# Patient Record
Sex: Male | Born: 1995 | Hispanic: No | Marital: Single | Smoking: Never smoker
Health system: Southern US, Community
[De-identification: ages and names within clinical notes are randomized; demographics above are authoritative.]

---
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
## 2015-01-18 ENCOUNTER — Emergency Department (HOSPITAL_COMMUNITY)
Admission: EM | Admit: 2015-01-18 | Discharge: 2015-01-18 | Disposition: A | Discharge status: Home / Self Care | Payer: Self-pay | Source: Home / Self Care | Attending: Emergency Medicine | Admitting: Emergency Medicine

## 2015-01-18 ENCOUNTER — Encounter (HOSPITAL_COMMUNITY): Payer: Self-pay | Admitting: Emergency Medicine

## 2015-01-18 ENCOUNTER — Emergency Department (INDEPENDENT_AMBULATORY_CARE_PROVIDER_SITE_OTHER): Payer: Self-pay

## 2015-01-18 DIAGNOSIS — S2232XA Fracture of one rib, left side, initial encounter for closed fracture: Secondary | ICD-10-CM

## 2015-01-18 MED ORDER — IBUPROFEN 800 MG PO TABS
800.0000 mg | ORAL_TABLET | Freq: Three times a day (TID) | ORAL | Status: AC | PRN
Start: 2015-01-18 — End: ?

## 2015-01-18 MED ORDER — TRAMADOL HCL 50 MG PO TABS: 50.0000 mg | ORAL_TABLET | Freq: Four times a day (QID) | ORAL | Status: AC | PRN

## 2015-01-18 NOTE — Discharge Instructions (Signed)
You may have a fractured rib. It is not displaced. This will heal, but it will take 4-6 weeks. Take ibuprofen 800 mg 3 times a day as needed for pain. Use tramadol twice a day as needed for severe pain. Do not drive while taking this medicine. No mountain biking until the pain has resolved. Follow-up as needed.
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
## 2015-02-09 ENCOUNTER — Encounter (HOSPITAL_COMMUNITY): Payer: Self-pay | Admitting: *Deleted

## 2015-02-09 ENCOUNTER — Emergency Department (INDEPENDENT_AMBULATORY_CARE_PROVIDER_SITE_OTHER)
Admission: EM | Admit: 2015-02-09 | Discharge: 2015-02-09 | Disposition: A | Discharge status: Home / Self Care | Payer: Self-pay | Source: Home / Self Care | Attending: Family Medicine | Admitting: Family Medicine

## 2015-02-09 ENCOUNTER — Emergency Department (INDEPENDENT_AMBULATORY_CARE_PROVIDER_SITE_OTHER): Payer: Self-pay

## 2015-02-09 DIAGNOSIS — R0789 Other chest pain: Secondary | ICD-10-CM

## 2015-02-09 NOTE — Discharge Instructions (Signed)
Heat, advil and activity as tolerated,
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
## 2015-02-09 NOTE — ED Notes (Signed)
Pt reports   Here  For  A  followup    After  Rib  Fracture     Continues  To  Have  l  Sided  Pain  Especially  When he  Takes  A  Deep  Breath           He  Is  Sitting  Upright  On  The  Exam room   Speaking   In  Complete  sentances

## 2015-12-21 ENCOUNTER — Ambulatory Visit (INDEPENDENT_AMBULATORY_CARE_PROVIDER_SITE_OTHER): Payer: Self-pay | Admitting: Physician Assistant

## 2015-12-21 VITALS — BP 122/82 | HR 79 | Temp 97.9°F | Resp 16 | Ht 74.0 in | Wt 186.6 lb

## 2015-12-21 DIAGNOSIS — M5432 Sciatica, left side: Secondary | ICD-10-CM

## 2015-12-21 MED ORDER — PREDNISONE 20 MG PO TABS: ORAL_TABLET | ORAL | Status: AC

## 2015-12-21 MED ORDER — MELOXICAM 15 MG PO TABS: 15.0000 mg | ORAL_TABLET | Freq: Every day | ORAL | Status: AC

## 2015-12-21 MED ORDER — CYCLOBENZAPRINE HCL 10 MG PO TABS: 10.0000 mg | ORAL_TABLET | Freq: Three times a day (TID) | ORAL | Status: DC | PRN

## 2015-12-21 NOTE — Patient Instructions (Addendum)
IF you received an x-ray today, you will receive an invoice from [facility]. Please contact [facility] at [phone] with questions or concerns regarding your invoice.   IF you received labwork today, you will receive an invoice from [employer]. Please contact [facility] at [phone] with questions or concerns regarding your invoice.   Our billing staff will not be able to assist you with questions regarding bills from these companies.  You will be contacted with the lab results as soon as they are available. The fastest way to get your results is to activate your My Chart account. Instructions are located on the last page of this paperwork. If you have not heard from [place] regarding the results in 2 weeks, please contact this office.   Please ice the buttocks pain 3 times per day for 15 minutes.  Please do this directly after stretches.  Prior to stretches, use warm heat.  I would like you to do three of these pictures daily.  You will do the exercise in 5 reps.  This will progress to 8 reps after 1 week.  If it says to hold a position, you hold for 10 seconds.  Remember, directly after stretches you are icing.  Do not take the mobic with naproxen or ibuprofen.  You are able to take tylenol as needed. The flexeril is sedating (will make you sleepy).  Please do not operate heavy machinery (driving, etc.) with the use of this medicine.  Sciatica With Rehab The sciatic nerve runs from the back down the leg and is responsible for sensation and control of the muscles in the back (posterior) side of the thigh, lower leg, and foot. Sciatica is a condition that is characterized by inflammation of this nerve.  SYMPTOMS   Signs of nerve damage, including numbness and/or weakness along the posterior side of the lower extremity.  Pain in the back of the thigh that may also travel down the leg.  Pain that worsens when sitting for long periods of  time.  Occasionally, pain in the back or buttock. CAUSES  Inflammation of the sciatic nerve is the cause of sciatica. The inflammation is due to something irritating the nerve. Common sources of irritation include:  Sitting for long periods of time.  Direct trauma to the nerve.  Arthritis of the spine.  Herniated or ruptured disk.  Slipping of the vertebrae (spondylolisthesis).  Pressure from soft tissues, such as muscles or ligament-like tissue (fascia). RISK INCREASES WITH:  Sports that place pressure or stress on the spine (football or weightlifting).  Poor strength and flexibility.  Failure to warm up properly before activity.  Family history of low back pain or disk disorders.  Previous back injury or surgery.  Poor body mechanics, especially when lifting, or poor posture. PREVENTION   Warm up and stretch properly before activity.  Maintain physical fitness:  Strength, flexibility, and endurance.  Cardiovascular fitness.  Learn and use proper technique, especially with posture and lifting. When possible, have coach correct improper technique.  Avoid activities that place stress on the spine. PROGNOSIS If treated properly, then sciatica usually resolves within 6 weeks. However, occasionally surgery is necessary.  RELATED COMPLICATIONS   Permanent nerve damage, including pain, numbness, tingle, or weakness.  Chronic back pain.  Risks of surgery: infection, bleeding, nerve damage, or damage to surrounding tissues. TREATMENT Treatment initially involves resting from any activities that aggravate your symptoms. The use of ice and medication may help reduce pain and  inflammation. The use of strengthening and stretching exercises may help reduce pain with activity. These exercises may be performed at home or with referral to a therapist. A therapist may recommend further treatments, such as transcutaneous electronic nerve stimulation (TENS) or ultrasound. Your  caregiver may recommend corticosteroid injections to help reduce inflammation of the sciatic nerve. If symptoms persist despite non-surgical (conservative) treatment, then surgery may be recommended. MEDICATION  If pain medication is necessary, then nonsteroidal anti-inflammatory medications, such as aspirin and ibuprofen, or other minor pain relievers, such as acetaminophen, are often recommended.  Do not take pain medication for 7 days before surgery.  Prescription pain relievers may be given if deemed necessary by your caregiver. Use only as directed and only as much as you need.  Ointments applied to the skin may be helpful.  Corticosteroid injections may be given by your caregiver. These injections should be reserved for the most serious cases, because they may only be given a certain number of times. HEAT AND COLD  Cold treatment (icing) relieves pain and reduces inflammation. Cold treatment should be applied for 10 to 15 minutes every 2 to 3 hours for inflammation and pain and immediately after any activity that aggravates your symptoms. Use ice packs or massage the area with a piece of ice (ice massage).  Heat treatment may be used prior to performing the stretching and strengthening activities prescribed by your caregiver, physical therapist, or athletic trainer. Use a heat pack or soak the injury in warm water. SEEK MEDICAL CARE IF:  Treatment seems to offer no benefit, or the condition worsens.  Any medications produce adverse side effects. EXERCISES  RANGE OF MOTION (ROM) AND STRETCHING EXERCISES - Sciatica Most people with sciatic will find that their symptoms worsen with either excessive bending forward (flexion) or arching at the low back (extension). The exercises which will help resolve your symptoms will focus on the opposite motion. Your physician, physical therapist or athletic trainer will help you determine which exercises will be most helpful to resolve your low back  pain. Do not complete any exercises without first consulting with your clinician. Discontinue any exercises which worsen your symptoms until you speak to your clinician. If you have pain, numbness or tingling which travels down into your buttocks, leg or foot, the goal of the therapy is for these symptoms to move closer to your back and eventually resolve. Occasionally, these leg symptoms will get better, but your low back pain may worsen; this is typically an indication of progress in your rehabilitation. Be certain to be very alert to any changes in your symptoms and the activities in which you participated in the 24 hours prior to the change. Sharing this information with your clinician will allow him/her to most efficiently treat your condition. These exercises may help you when beginning to rehabilitate your injury. Your symptoms may resolve with or without further involvement from your physician, physical therapist or athletic trainer. While completing these exercises, remember:   Restoring tissue flexibility helps normal motion to return to the joints. This allows healthier, less painful movement and activity.  An effective stretch should be held for at least 30 seconds.  A stretch should never be painful. You should only feel a gentle lengthening or release in the stretched tissue. FLEXION RANGE OF MOTION AND STRETCHING EXERCISES: STRETCH - Flexion, Single Knee to Chest   Lie on a firm bed or floor with both legs extended in front of you.  Keeping one leg in contact with  the floor, bring your opposite knee to your chest. Hold your leg in place by either grabbing behind your thigh or at your knee.  Pull until you feel a gentle stretch in your low back. Hold __________ seconds.  Slowly release your grasp and repeat the exercise with the opposite side. Repeat __________ times. Complete this exercise __________ times per day.  STRETCH - Flexion, Double Knee to Chest  Lie on a firm bed or  floor with both legs extended in front of you.  Keeping one leg in contact with the floor, bring your opposite knee to your chest.  Tense your stomach muscles to support your back and then lift your other knee to your chest. Hold your legs in place by either grabbing behind your thighs or at your knees.  Pull both knees toward your chest until you feel a gentle stretch in your low back. Hold __________ seconds.  Tense your stomach muscles and slowly return one leg at a time to the floor. Repeat __________ times. Complete this exercise __________ times per day.  STRETCH - Low Trunk Rotation   Lie on a firm bed or floor. Keeping your legs in front of you, bend your knees so they are both pointed toward the ceiling and your feet are flat on the floor.  Extend your arms out to the side. This will stabilize your upper body by keeping your shoulders in contact with the floor.  Gently and slowly drop both knees together to one side until you feel a gentle stretch in your low back. Hold for __________ seconds.  Tense your stomach muscles to support your low back as you bring your knees back to the starting position. Repeat the exercise to the other side. Repeat __________ times. Complete this exercise __________ times per day  EXTENSION RANGE OF MOTION AND FLEXIBILITY EXERCISES: STRETCH - Extension, Prone on Elbows  Lie on your stomach on the floor, a bed will be too soft. Place your palms about shoulder width apart and at the height of your head.  Place your elbows under your shoulders. If this is too painful, stack pillows under your chest.  Allow your body to relax so that your hips drop lower and make contact more completely with the floor.  Hold this position for __________ seconds.  Slowly return to lying flat on the floor. Repeat __________ times. Complete this exercise __________ times per day.  RANGE OF MOTION - Extension, Prone Press Ups  Lie on your stomach on the floor, a bed  will be too soft. Place your palms about shoulder width apart and at the height of your head.  Keeping your back as relaxed as possible, slowly straighten your elbows while keeping your hips on the floor. You may adjust the placement of your hands to maximize your comfort. As you gain motion, your hands will come more underneath your shoulders.  Hold this position __________ seconds.  Slowly return to lying flat on the floor. Repeat __________ times. Complete this exercise __________ times per day.  STRENGTHENING EXERCISES - Sciatica  These exercises may help you when beginning to rehabilitate your injury. These exercises should be done near your "sweet spot." This is the neutral, low-back arch, somewhere between fully rounded and fully arched, that is your least painful position. When performed in this safe range of motion, these exercises can be used for people who have either a flexion or extension based injury. These exercises may resolve your symptoms with or without further involvement from  your physician, physical therapist or athletic trainer. While completing these exercises, remember:   Muscles can gain both the endurance and the strength needed for everyday activities through controlled exercises.  Complete these exercises as instructed by your physician, physical therapist or athletic trainer. Progress with the resistance and repetition exercises only as your caregiver advises.  You may experience muscle soreness or fatigue, but the pain or discomfort you are trying to eliminate should never worsen during these exercises. If this pain does worsen, stop and make certain you are following the directions exactly. If the pain is still present after adjustments, discontinue the exercise until you can discuss the trouble with your clinician. STRENGTHENING - Deep Abdominals, Pelvic Tilt   Lie on a firm bed or floor. Keeping your legs in front of you, bend your knees so they are both pointed  toward the ceiling and your feet are flat on the floor.  Tense your lower abdominal muscles to press your low back into the floor. This motion will rotate your pelvis so that your tail bone is scooping upwards rather than pointing at your feet or into the floor.  With a gentle tension and even breathing, hold this position for __________ seconds. Repeat __________ times. Complete this exercise __________ times per day.  STRENGTHENING - Abdominals, Crunches   Lie on a firm bed or floor. Keeping your legs in front of you, bend your knees so they are both pointed toward the ceiling and your feet are flat on the floor. Cross your arms over your chest.  Slightly tip your chin down without bending your neck.  Tense your abdominals and slowly lift your trunk high enough to just clear your shoulder blades. Lifting higher can put excessive stress on the low back and does not further strengthen your abdominal muscles.  Control your return to the starting position. Repeat __________ times. Complete this exercise __________ times per day.  STRENGTHENING - Quadruped, Opposite UE/LE Lift  Assume a hands and knees position on a firm surface. Keep your hands under your shoulders and your knees under your hips. You may place padding under your knees for comfort.  Find your neutral spine and gently tense your abdominal muscles so that you can maintain this position. Your shoulders and hips should form a rectangle that is parallel with the floor and is not twisted.  Keeping your trunk steady, lift your right hand no higher than your shoulder and then your left leg no higher than your hip. Make sure you are not holding your breath. Hold this position __________ seconds.  Continuing to keep your abdominal muscles tense and your back steady, slowly return to your starting position. Repeat with the opposite arm and leg. Repeat __________ times. Complete this exercise __________ times per day.  STRENGTHENING -  Abdominals and Quadriceps, Straight Leg Raise   Lie on a firm bed or floor with both legs extended in front of you.  Keeping one leg in contact with the floor, bend the other knee so that your foot can rest flat on the floor.  Find your neutral spine, and tense your abdominal muscles to maintain your spinal position throughout the exercise.  Slowly lift your straight leg off the floor about 6 inches for a count of 15, making sure to not hold your breath.  Still keeping your neutral spine, slowly lower your leg all the way to the floor. Repeat this exercise with each leg __________ times. Complete this exercise __________ times per day. POSTURE AND  BODY MECHANICS CONSIDERATIONS - Sciatica Keeping correct posture when sitting, standing or completing your activities will reduce the stress put on different body tissues, allowing injured tissues a chance to heal and limiting painful experiences. The following are general guidelines for improved posture. Your physician or physical therapist will provide you with any instructions specific to your needs. While reading these guidelines, remember:  The exercises prescribed by your provider will help you have the flexibility and strength to maintain correct postures.  The correct posture provides the optimal environment for your joints to work. All of your joints have less wear and tear when properly supported by a spine with good posture. This means you will experience a healthier, less painful body.  Correct posture must be practiced with all of your activities, especially prolonged sitting and standing. Correct posture is as important when doing repetitive low-stress activities (typing) as it is when doing a single heavy-load activity (lifting). RESTING POSITIONS Consider which positions are most painful for you when choosing a resting position. If you have pain with flexion-based activities (sitting, bending, stooping, squatting), choose a position  that allows you to rest in a less flexed posture. You would want to avoid curling into a fetal position on your side. If your pain worsens with extension-based activities (prolonged standing, working overhead), avoid resting in an extended position such as sleeping on your stomach. Most people will find more comfort when they rest with their spine in a more neutral position, neither too rounded nor too arched. Lying on a non-sagging bed on your side with a pillow between your knees, or on your back with a pillow under your knees will often provide some relief. Keep in mind, being in any one position for a prolonged period of time, no matter how correct your posture, can still lead to stiffness. PROPER SITTING POSTURE In order to minimize stress and discomfort on your spine, you must sit with correct posture Sitting with good posture should be effortless for a healthy body. Returning to good posture is a gradual process. Many people can work toward this most comfortably by using various supports until they have the flexibility and strength to maintain this posture on their own. When sitting with proper posture, your ears will fall over your shoulders and your shoulders will fall over your hips. You should use the back of the chair to support your upper back. Your low back will be in a neutral position, just slightly arched. You may place a small pillow or folded towel at the base of your low back for support.  When working at a desk, create an environment that supports good, upright posture. Without extra support, muscles fatigue and lead to excessive strain on joints and other tissues. Keep these recommendations in mind: CHAIR:   A chair should be able to slide under your desk when your back makes contact with the back of the chair. This allows you to work closely.  The chair's height should allow your eyes to be level with the upper part of your monitor and your hands to be slightly lower than your  elbows. BODY POSITION  Your feet should make contact with the floor. If this is not possible, use a foot rest.  Keep your ears over your shoulders. This will reduce stress on your neck and low back. INCORRECT SITTING POSTURES   If you are feeling tired and unable to assume a healthy sitting posture, do not slouch or slump. This puts excessive strain on your back  tissues, causing more damage and pain. Healthier options include:  Using more support, like a lumbar pillow.  Switching tasks to something that requires you to be upright or walking.  Talking a brief walk.  Lying down to rest in a neutral-spine position. PROLONGED STANDING WHILE SLIGHTLY LEANING FORWARD  When completing a task that requires you to lean forward while standing in one place for a long time, place either foot up on a stationary 2-4 inch high object to help maintain the best posture. When both feet are on the ground, the low back tends to lose its slight inward curve. If this curve flattens (or becomes too large), then the back and your other joints will experience too much stress, fatigue more quickly and can cause pain.  CORRECT STANDING POSTURES Proper standing posture should be assumed with all daily activities, even if they only take a few moments, like when brushing your teeth. As in sitting, your ears should fall over your shoulders and your shoulders should fall over your hips. You should keep a slight tension in your abdominal muscles to brace your spine. Your tailbone should point down to the ground, not behind your body, resulting in an over-extended swayback posture.  INCORRECT STANDING POSTURES  Common incorrect standing postures include a forward head, locked knees and/or an excessive swayback. WALKING Walk with an upright posture. Your ears, shoulders and hips should all line-up. PROLONGED ACTIVITY IN A FLEXED POSITION When completing a task that requires you to bend forward at your waist or lean over a  low surface, try to find a way to stabilize 3 of 4 of your limbs. You can place a hand or elbow on your thigh or rest a knee on the surface you are reaching across. This will provide you more stability so that your muscles do not fatigue as quickly. By keeping your knees relaxed, or slightly bent, you will also reduce stress across your low back. CORRECT LIFTING TECHNIQUES DO :   Assume a wide stance. This will provide you more stability and the opportunity to get as close as possible to the object which you are lifting.  Tense your abdominals to brace your spine; then bend at the knees and hips. Keeping your back locked in a neutral-spine position, lift using your leg muscles. Lift with your legs, keeping your back straight.  Test the weight of unknown objects before attempting to lift them.  Try to keep your elbows locked down at your sides in order get the best strength from your shoulders when carrying an object.  Always ask for help when lifting heavy or awkward objects. INCORRECT LIFTING TECHNIQUES DO NOT:   Lock your knees when lifting, even if it is a small object.  Bend and twist. Pivot at your feet or move your feet when needing to change directions.  Assume that you cannot safely pick up a paperclip without proper posture.   This information is not intended to replace advice given to you by your health care provider. Make sure you discuss any questions you have with your health care provider.   Document Released: 09/18/2005 Document Revised: 02/02/2015 Document Reviewed: 12/31/2008 [employer] Interactive Patient Education [employer]

## 2015-12-21 NOTE — Progress Notes (Signed)
[facility] [address], [place] [address] [phone]  Date:  12/21/2015   Name:  Marcus Fields   DOB:  02-04-1996   MRN:  [id number]  PCP:  No primary care provider on file.   Chief Complaint  Patient presents with  . Leg Pain    x 1 month,hard to sleep, pain shooting down leg     History of Present Illness:  Marcus Fields is a 20 y.o. male patient who presents to [facility]   For left leg pain.    Pain at his left glute that radiates down to the ankle.  This started 1 month ago.   He has had no numbness or tingling, just a shooting painThere is no instability or weakness --however sometimes the pain can be almost debilitating for him. 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   He has used ibuprofen for the pain. He denies any blood in his stool or melena. Patient will play basketball once a week. He does not exercise daily. Pain is aggravated when he moves from sitting to standing position.     There are no active problems to display for this patient.   History reviewed. No pertinent past medical history.  History reviewed. No pertinent past surgical history.  Social History  Substance Use Topics  . Smoking status: Never Smoker   . Smokeless tobacco: None  . Alcohol Use: No    History reviewed. No pertinent family history.  No Known Allergies  Medication list has been reviewed and updated.  Current Outpatient Prescriptions on File Prior to Visit  Medication Sig Dispense Refill  . ibuprofen (ADVIL,MOTRIN) 800 MG tablet Take 1 tablet (800 mg total) by mouth every 8 (eight) hours as needed for mild pain or moderate pain. 30 tablet 0  . traMADol (ULTRAM) 50 MG tablet Take 1 tablet (50 mg total) by mouth every 6 (six) hours as needed. (Patient not taking: Reported on 12/21/2015) 15 tablet 0   No current facility-administered medications on file prior to visit.     ROS  ROS otherwise unremarkable unless listed above.  Physical Examination: BP 122/82 mmHg  Pulse 79  Temp(Src) 97.9 F (36.6 C) (Oral)  Resp 16  Ht  (1.88 m)  Wt 186 lb 9.6 oz (84.641 kg)  BMI 23.95 kg/m2  SpO2 99% Ideal Body Weight: Weight in (lb) to have BMI = 25: 194.3  Physical Exam  Constitutional: He is oriented to person, place, and time. He appears well-developed and well-nourished. No distress.  HENT:  Head: Normocephalic and atraumatic.  Eyes: Conjunctivae and EOM are normal. Pupils are equal, round, and reactive to light.  Cardiovascular: Normal rate.   Pulmonary/Chest: Effort normal. No respiratory distress.  Musculoskeletal:   There is minimal sacral tenderness, however no spinous tenderness throughout other regions. Pain with hip flexion.  Resisted strength is normal. Positive straight leg raise test Pain with hip internal and external rotation. There is also tenderness along the piriformis area.  Neurological: He is alert and oriented to person, place, and time.  Skin: Skin is warm and dry. He is not diaphoretic.  Psychiatric: He has a normal mood and affect. His behavior is normal.     Assessment and Plan: Marcus Fields is a 20 y.o. male who is here today for leg pain for 2 months. -- this appears to be  Significant sciatic nerve pain vs piriformis.  We will start the regimen listed below. I have advised a home physical therapy regimen that he will do -- heating beforehand and icing afterward 3 times per day. -- he declines x-ray at this time due to cost. If this does not improve he will need imaging.   Sciatic nerve pain, left - Plan: meloxicam (MOBIC) 15 MG tablet, predniSONE (DELTASONE) 20 MG tablet, cyclobenzaprine (FLEXERIL) 10 MG tablet   Trena PlattStephanie Reynalda Canny, PA-C [facility] 12/21/2015 8:33 AM

## 2016-10-05 IMAGING — DX DG RIBS W/ CHEST 3+V*L*
5 series · 5 of 5 positions shown · non-contrast
Comparison: None

CLINICAL DATA: Left rib pain, fall 1 week ago

EXAM:
LEFT RIBS AND CHEST - 3+ VIEW

[chest pa]
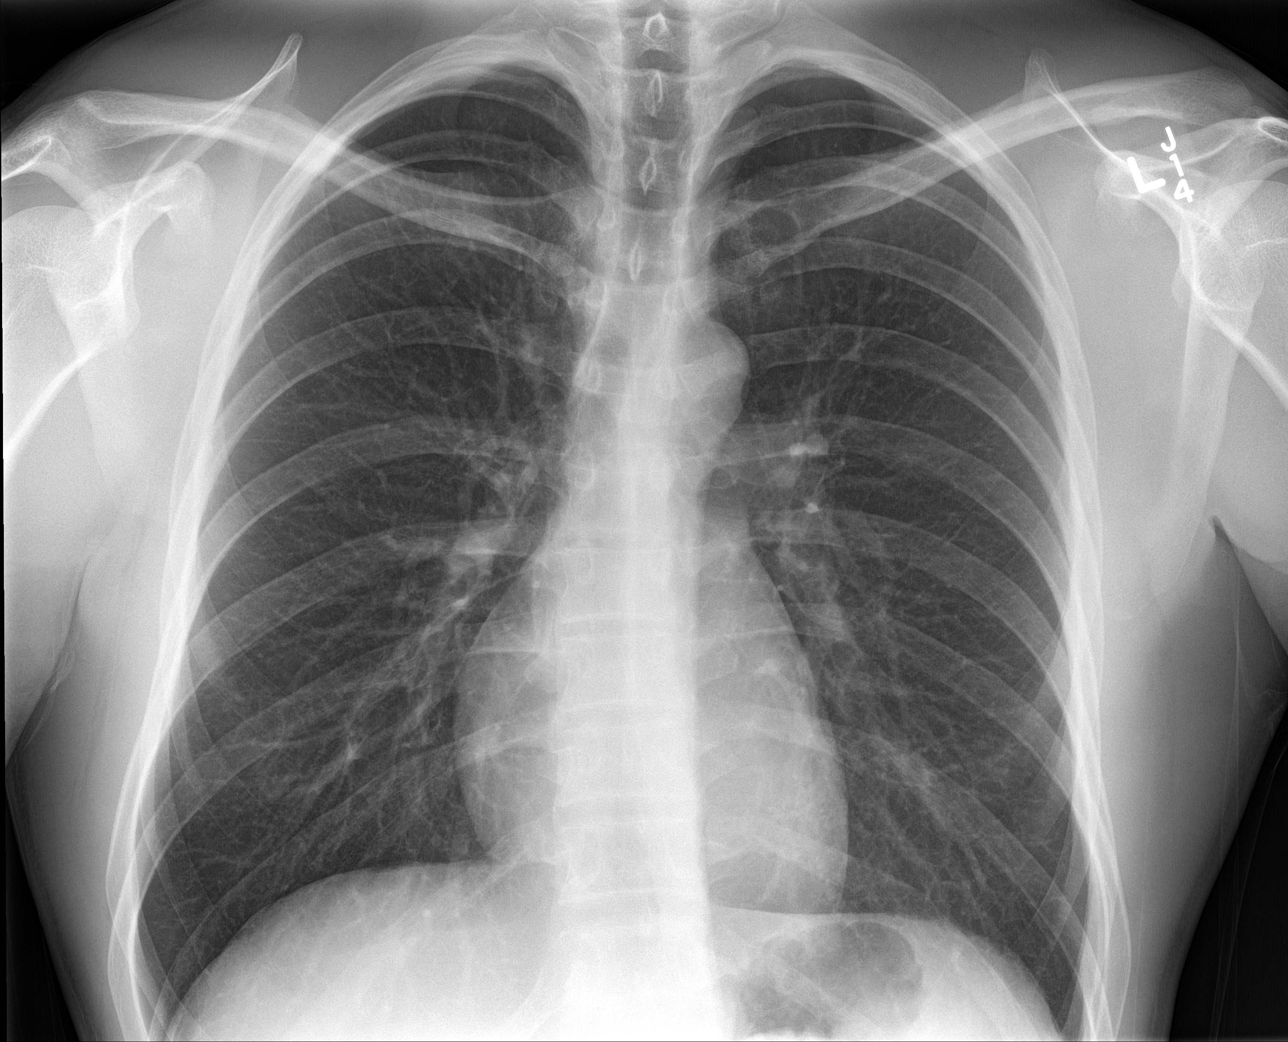

[rib pa (1 of 2)]
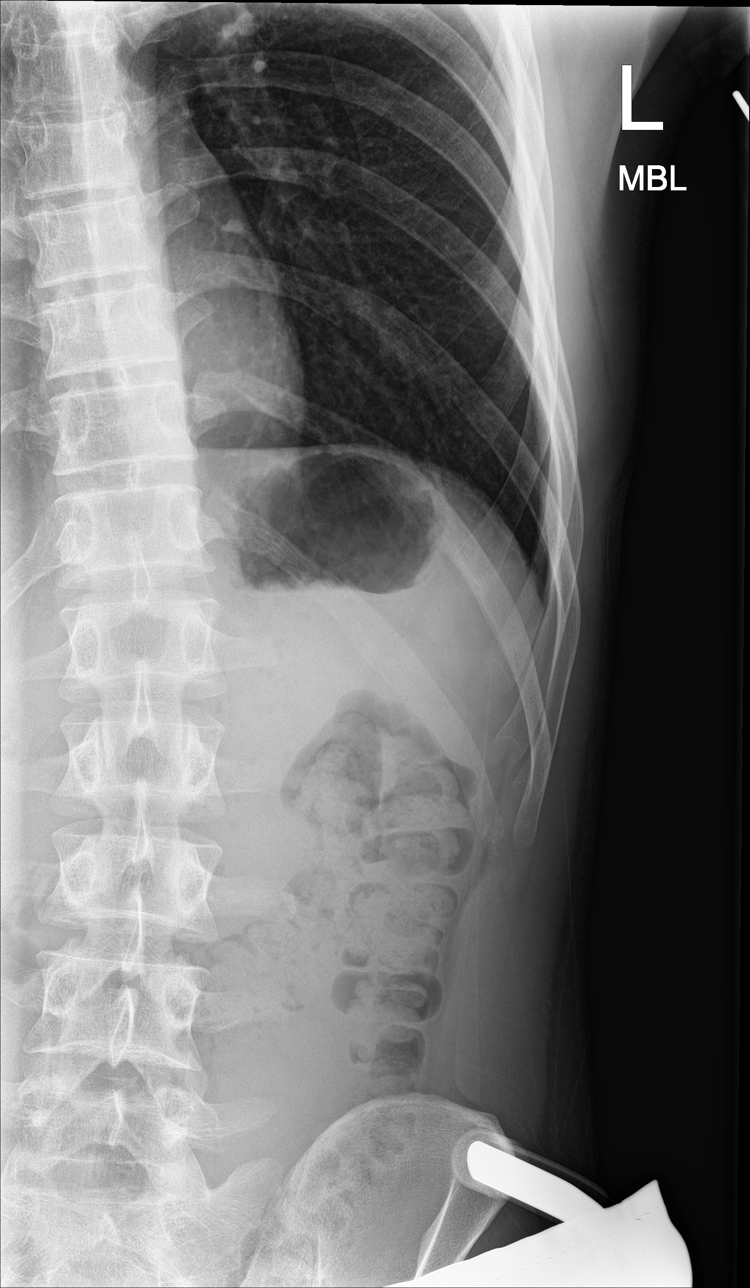

[rib pa (2 of 2)]
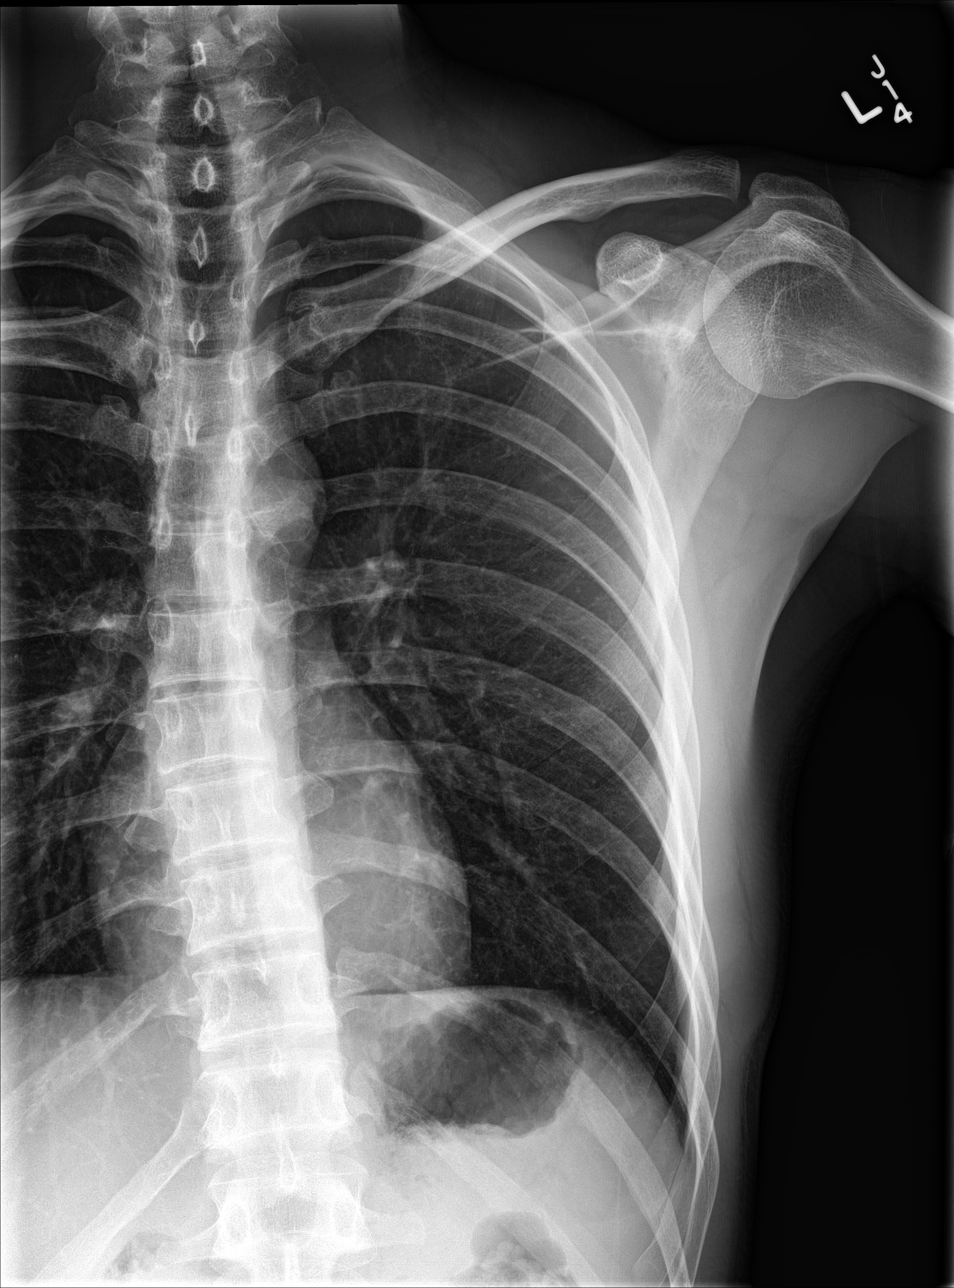

[rib obl (1 of 2)]
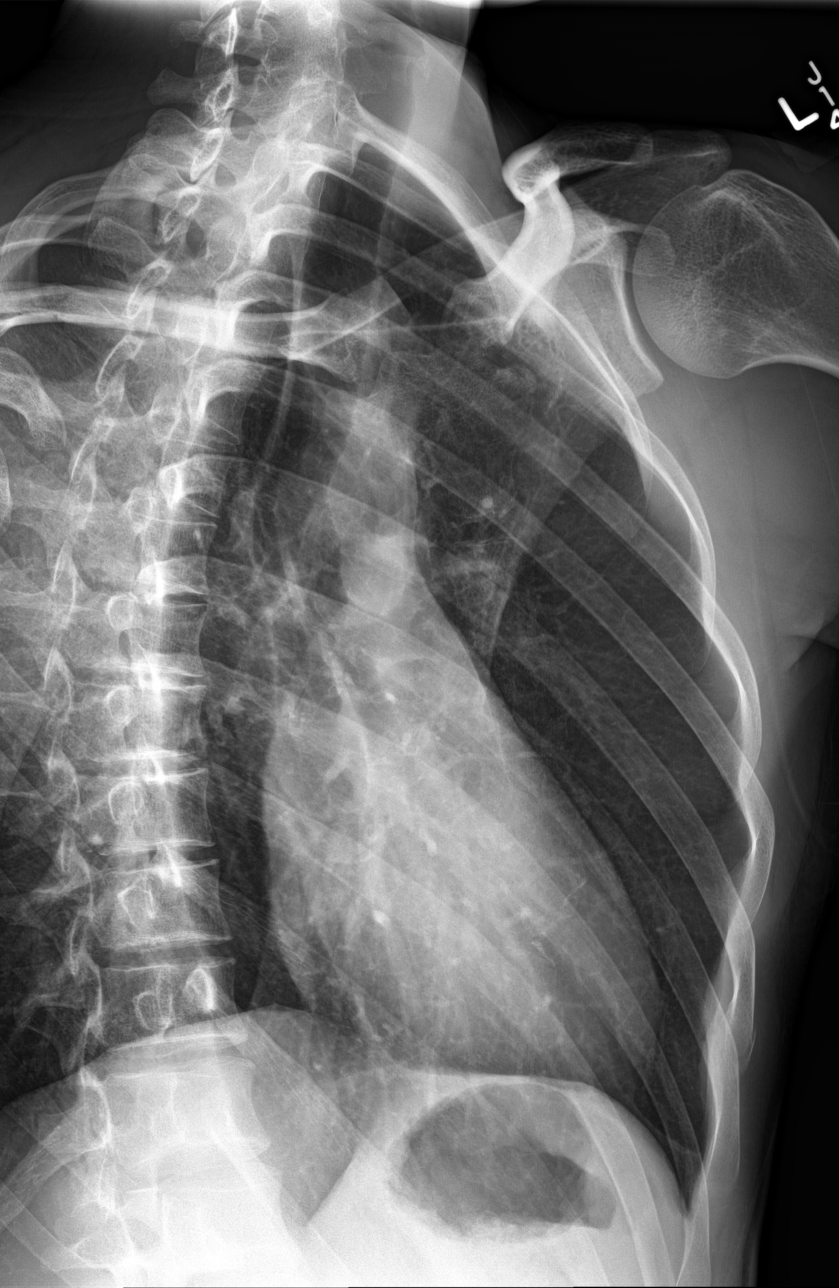

[rib obl (2 of 2)]
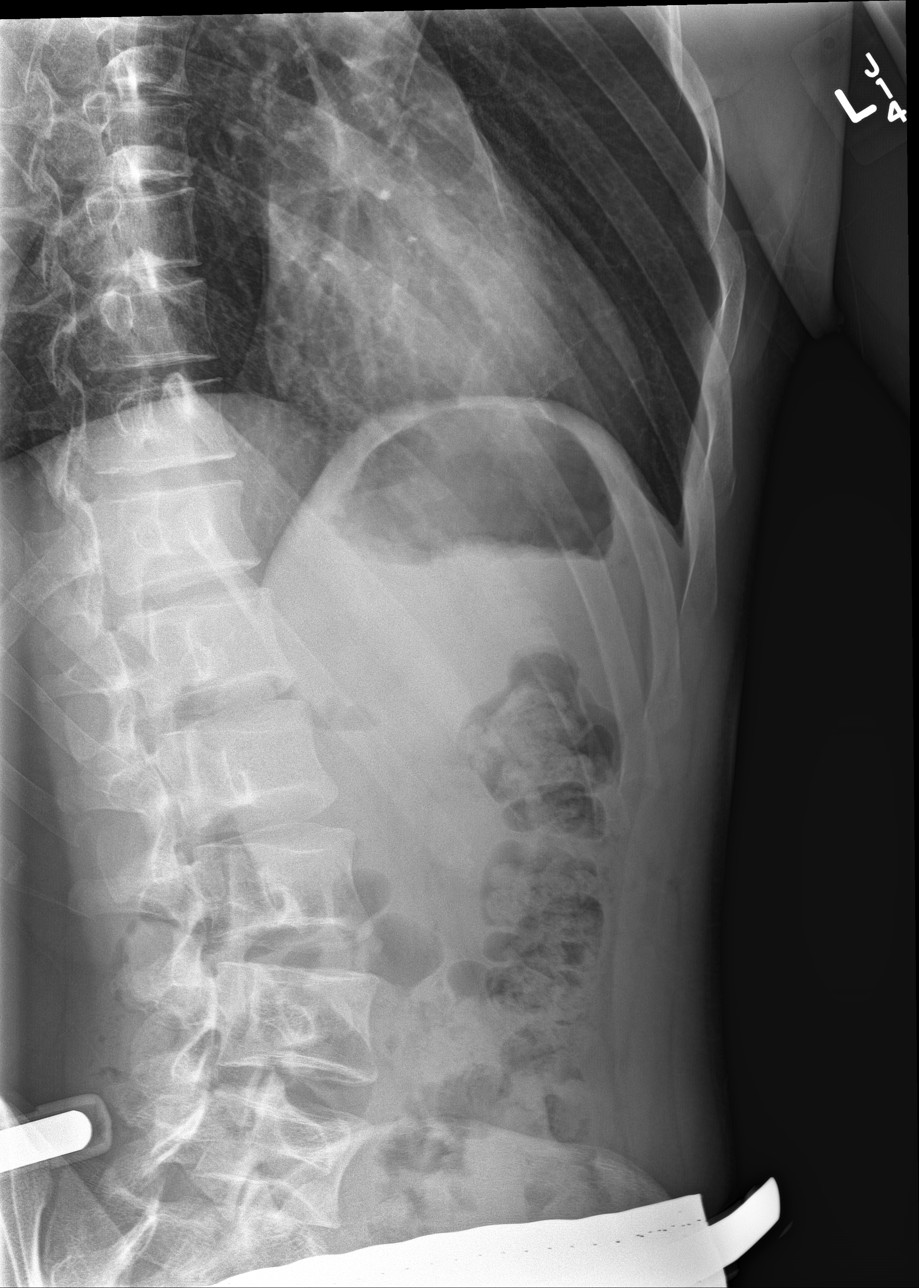

[5 of 5 positions shown; findings below may reference images not displayed]

FINDINGS: Five views of the left ribs submitted. No acute infiltrate or
pulmonary edema. Question nondisplaced fracture of the left 6 rib.
No pneumothorax.
IMPRESSION: No acute infiltrate or pulmonary edema. Question nondisplaced
fracture of the left sixth rib. No pneumothorax.

## 2021-07-31 ENCOUNTER — Emergency Department (HOSPITAL_COMMUNITY)
Admission: EM | Admit: 2021-07-31 | Discharge: 2021-07-31 | Disposition: A | Discharge status: Home / Self Care | Payer: No Typology Code available for payment source | Attending: Emergency Medicine | Admitting: Emergency Medicine

## 2021-07-31 ENCOUNTER — Emergency Department (HOSPITAL_COMMUNITY): Payer: No Typology Code available for payment source

## 2021-07-31 ENCOUNTER — Encounter (HOSPITAL_COMMUNITY): Payer: Self-pay | Admitting: Pharmacy Technician

## 2021-07-31 DIAGNOSIS — Y92410 Unspecified street and highway as the place of occurrence of the external cause: Secondary | ICD-10-CM | POA: Diagnosis not present

## 2021-07-31 DIAGNOSIS — S20211A Contusion of right front wall of thorax, initial encounter: Secondary | ICD-10-CM | POA: Diagnosis not present

## 2021-07-31 DIAGNOSIS — S0990XA Unspecified injury of head, initial encounter: Secondary | ICD-10-CM | POA: Diagnosis present

## 2021-07-31 DIAGNOSIS — S060X0A Concussion without loss of consciousness, initial encounter: Secondary | ICD-10-CM | POA: Insufficient documentation

## 2021-07-31 DIAGNOSIS — M549 Dorsalgia, unspecified: Secondary | ICD-10-CM | POA: Insufficient documentation

## 2021-07-31 DIAGNOSIS — S298XXA Other specified injuries of thorax, initial encounter: Secondary | ICD-10-CM

## 2021-07-31 DIAGNOSIS — S01511A Laceration without foreign body of lip, initial encounter: Secondary | ICD-10-CM

## 2021-07-31 LAB — BASIC METABOLIC PANEL
Anion gap: 6 (ref 5–15)
BUN: 20 mg/dL (ref 6–20)
CO2: 25 mmol/L (ref 22–32)
Calcium: 9.2 mg/dL (ref 8.9–10.3)
Chloride: 106 mmol/L (ref 98–111)
Creatinine, Ser: 1.14 mg/dL (ref 0.61–1.24)
GFR, Estimated: >60 mL/min (ref >60)
Glucose, Bld: 91 mg/dL (ref 70–99)
Potassium: 4 mmol/L (ref 3.5–5.1)
Sodium: 137 mmol/L (ref 135–145)

## 2021-07-31 LAB — CBC
HCT: 40.8 % (ref 39.0–52.0)
Hemoglobin: 13.9 g/dL (ref 13.0–17.0)
MCH: 30.2 pg (ref 26.0–34.0)
MCHC: 34.1 g/dL (ref 30.0–36.0)
MCV: 88.5 fL (ref 80.0–100.0)
Platelets: 193 10*3/uL (ref 150–400)
RBC: 4.61 MIL/uL (ref 4.22–5.81)
RDW: 11.9 % (ref 11.5–15.5)
WBC: 8.4 10*3/uL (ref 4.0–10.5)
nRBC: 0 % (ref 0.0–0.2)

## 2021-07-31 MED ORDER — KETOROLAC TROMETHAMINE 30 MG/ML IJ SOLN
15.0000 mg | Freq: Once | INTRAMUSCULAR | Status: AC
  Administered 2021-07-31: 15 mg via INTRAVENOUS
  Filled 2021-07-31: qty 1

## 2021-07-31 MED ORDER — METHOCARBAMOL 500 MG PO TABS: 500.0000 mg | ORAL_TABLET | Freq: Three times a day (TID) | ORAL | 0 refills | Status: AC | PRN

## 2021-07-31 MED ORDER — IOHEXOL 350 MG/ML SOLN
75.0000 mL | Freq: Once | INTRAVENOUS | Status: AC | PRN
Start: 2021-07-31 — End: 2021-07-31
  Administered 2021-07-31: 75 mL via INTRAVENOUS
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
## 2021-07-31 NOTE — ED Notes (Signed)
Pt resting comfortably on stretcher. Pt laying flat for comfort, offered to sit up and pt denies. No acute changes noted. Will continue to monitor.
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
## 2021-07-31 NOTE — ED Notes (Signed)
Patient transported to CT
